# Patient Record
Sex: Male | Born: 1965 | Race: Black or African American | Hispanic: No | Marital: Married | Smoking: Never smoker
Health system: Southern US, Community
[De-identification: ages and names within clinical notes are randomized; demographics above are authoritative.]

## PROBLEM LIST (undated history)

## (undated) DIAGNOSIS — K648 Other hemorrhoids: Secondary | ICD-10-CM

## (undated) DIAGNOSIS — K219 Gastro-esophageal reflux disease without esophagitis: Secondary | ICD-10-CM

## (undated) HISTORY — PX: COLONOSCOPY: SHX174

---

## 2008-09-17 ENCOUNTER — Emergency Department: Payer: Self-pay | Admitting: Unknown Physician Specialty

## 2009-04-03 ENCOUNTER — Other Ambulatory Visit: Payer: Self-pay | Admitting: General Practice

## 2011-06-01 ENCOUNTER — Other Ambulatory Visit: Payer: Self-pay | Admitting: General Practice

## 2011-06-02 LAB — PSA: PSA: 0.7 ng/mL (ref 0.0–4.0)

## 2014-10-28 ENCOUNTER — Encounter: Payer: Self-pay | Admitting: Sports Medicine

## 2014-10-28 ENCOUNTER — Ambulatory Visit: Payer: 59

## 2014-10-28 ENCOUNTER — Ambulatory Visit (INDEPENDENT_AMBULATORY_CARE_PROVIDER_SITE_OTHER): Payer: 59 | Admitting: Sports Medicine

## 2014-10-28 VITALS — BP 155/92 | HR 64 | Resp 17

## 2014-10-28 DIAGNOSIS — M79672 Pain in left foot: Secondary | ICD-10-CM | POA: Diagnosis not present

## 2014-10-28 DIAGNOSIS — M722 Plantar fascial fibromatosis: Secondary | ICD-10-CM

## 2014-10-28 DIAGNOSIS — M216X1 Other acquired deformities of right foot: Secondary | ICD-10-CM

## 2014-10-28 DIAGNOSIS — M216X2 Other acquired deformities of left foot: Secondary | ICD-10-CM

## 2014-10-28 MED ORDER — TRIAMCINOLONE ACETONIDE 10 MG/ML IJ SUSP: 10.0000 mg | Freq: Once | INTRAMUSCULAR | Status: DC

## 2014-10-28 MED ORDER — MELOXICAM 15 MG PO TABS: 15.0000 mg | ORAL_TABLET | Freq: Every day | ORAL | Status: DC

## 2014-10-28 NOTE — Progress Notes (Signed)
Patient ID: Dan Walsh, male   DOB: 04/19/1965, 49 y.o.   MRN: [id number]  Subjective: Dan Walsh is a 49 y.o. male patient presents to office with complaint of heel pain on the left heel. Patient admits to post static dyskinesia for 9 months in duration; sharp pain to heel. Patient has treated this problem with foot massage. Patient reports that this treatment has not worked. 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  No current outpatient prescriptions on file prior to visit.   No current facility-administered medications on file prior to visit.   No Known Allergies  ROS: Negative   Objective: Physical Exam General: The patient is alert and oriented x3 in no acute distress.  Dermatology: Skin is warm, dry and supple bilateral lower extremities. Nails 1-10 are normal. There is no erythema, edema, no eccymosis, no open lesions present. Integument is otherwise unremarkable.  Vascular: Dorsalis Pedis pulse and Posterior Tibial pulse are 2/4 bilateral. Capillary fill time is immediate to all digits.  Neurological: Grossly intact to light touch with an achilles reflex of +2/5 and a  negative Tinel's sign bilateral.  Musculoskeletal: Tenderness to palpation at the medial calcaneal tubercale and through the insertion of the plantar fascia on the left foot. No pain with compression of calcaneus bilateral. No pain with tuning fork to calcaneus bilateral. There is decreased Ankle joint range of motion bilateral. All other joints within normal limits. Strength 5/5 in all groups bilateral. No pain with calf compression bilateral.   Gait: Unassisted, Antalgic avoid weight on left heel   Xray: 3 views, Left foot: No fracture, dislocation,Rectus foot type with mild 1st ray elevatus, Mild  increased density of plantar fascia at insertion at plantar calcaneus.   Assessment and Plan: Problem List Items Addressed This Visit    None    Visit Diagnoses    Left foot pain    -  Primary    Relevant Medications    meloxicam (MOBIC) 15 MG tablet    Other Relevant Orders    DG Foot Complete Left    Plantar fasciitis, left        Relevant Medications    triamcinolone acetonide (KENALOG) 10 MG/ML injection 10 mg    Acquired equinus deformity of both feet          -Complete examination performed. Discussed with patient in detail the condition of plantar fasciitis, how this occurs and general treatment options. Explained both conservative and surgical treatments.  -After oral consent and aseptic prep, injected a mixture containing 1 ml of 1%  plain lidocaine, 1 ml 0.25% plain marcaine, 0.5 ml of kenalog 10 and 0.5 ml of dexamethasone phosphate into left heel. Post-injection care discussed with patient.  -Rx Meloxicam 15mg  PO daily  -Recommended good supportive shoes and advised use of OTC insert. - Explained in detail the use of the fascial brace/ night splint which was dispensed at today's visit. -Explained and dispensed to patient daily stretching exercises. -Recommend patient to ice affected area 1-2x daily. -Patient to return to office in 4 weeks for follow up or sooner if problems or questions  Arise.  Landis Martins, DPM

## 2014-10-28 NOTE — Patient Instructions (Signed)
Plantar Fasciitis Plantar fasciitis is a painful foot condition that affects the heel. It occurs when the band of tissue that connects the toes to the heel bone (plantar fascia) becomes irritated. This can happen after exercising too much or doing other repetitive activities (overuse injury). The pain from plantar fasciitis can range from mild irritation to severe pain that makes it difficult for you to walk or move. The pain is usually worse in the morning or after you have been sitting or lying down for a while. CAUSES This condition may be caused by:  Standing for long periods of time.  Wearing shoes that do not fit.  Doing high-impact activities, including running, aerobics, and ballet.  Being overweight.  Having an abnormal way of walking (gait).  Having tight calf muscles.  Having high arches in your feet.  Starting a new athletic activity. SYMPTOMS The main symptom of this condition is heel pain. Other symptoms include:  Pain that gets worse after activity or exercise.  Pain that is worse in the morning or after resting.  Pain that goes away after you walk for a few minutes. DIAGNOSIS This condition may be diagnosed based on your signs and symptoms. Your health care provider will also do a physical exam to check for:  A tender area on the bottom of your foot.  A high arch in your foot.  Pain when you move your foot.  Difficulty moving your foot. You may also need to have imaging studies to confirm the diagnosis. These can include:  X-rays.  Ultrasound.  MRI. TREATMENT  Treatment for plantar fasciitis depends on the severity of the condition. Your treatment may include:  Rest, ice, and over-the-counter pain medicines to manage your pain.  Exercises to stretch your calves and your plantar fascia.  A splint that holds your foot in a stretched, upward position while you sleep (night splint).  Physical therapy to relieve symptoms and prevent problems in the  future.  Cortisone injections to relieve severe pain.  Extracorporeal shock wave therapy (ESWT) to stimulate damaged plantar fascia with electrical impulses. It is often used as a last resort before surgery.  Surgery, if other treatments have not worked after 12 months. HOME CARE INSTRUCTIONS  Take medicines only as directed by your health care provider.  Avoid activities that cause pain.  Roll the bottom of your foot over a bag of ice or a bottle of cold water. Do this for 20 minutes, 3-4 times a day.  Perform simple stretches as directed by your health care provider.  Try wearing athletic shoes with air-sole or gel-sole cushions or soft shoe inserts.  Wear a night splint while sleeping, if directed by your health care provider.  Keep all follow-up appointments with your health care provider. PREVENTION   Do not perform exercises or activities that cause heel pain.  Consider finding low-impact activities if you continue to have problems.  Lose weight if you need to. The best way to prevent plantar fasciitis is to avoid the activities that aggravate your plantar fascia. SEEK MEDICAL CARE IF:  Your symptoms do not go away after treatment with home care measures.  Your pain gets worse.  Your pain affects your ability to move or do your daily activities.   This information is not intended to replace advice given to you by your health care provider. Make sure you discuss any questions you have with your health care provider.   Document Released: 09/25/2000 Document Revised: 09/21/2014 Document Reviewed: 11/10/2013 Elsevier   Interactive Patient Education 2016 [employer]  

## 2014-11-25 ENCOUNTER — Ambulatory Visit: Payer: 59 | Admitting: Sports Medicine

## 2015-03-03 DIAGNOSIS — J069 Acute upper respiratory infection, unspecified: Secondary | ICD-10-CM | POA: Diagnosis not present

## 2015-04-11 DIAGNOSIS — K625 Hemorrhage of anus and rectum: Secondary | ICD-10-CM | POA: Diagnosis not present

## 2015-04-13 DIAGNOSIS — K625 Hemorrhage of anus and rectum: Secondary | ICD-10-CM | POA: Diagnosis not present

## 2015-05-11 DIAGNOSIS — K621 Rectal polyp: Secondary | ICD-10-CM | POA: Diagnosis not present

## 2015-05-11 DIAGNOSIS — K644 Residual hemorrhoidal skin tags: Secondary | ICD-10-CM | POA: Diagnosis not present

## 2015-05-11 DIAGNOSIS — K635 Polyp of colon: Secondary | ICD-10-CM | POA: Diagnosis not present

## 2015-05-11 DIAGNOSIS — K625 Hemorrhage of anus and rectum: Secondary | ICD-10-CM | POA: Diagnosis not present

## 2015-05-11 DIAGNOSIS — K640 First degree hemorrhoids: Secondary | ICD-10-CM | POA: Diagnosis not present

## 2015-05-11 DIAGNOSIS — D124 Benign neoplasm of descending colon: Secondary | ICD-10-CM | POA: Diagnosis not present

## 2015-05-11 DIAGNOSIS — D129 Benign neoplasm of anus and anal canal: Secondary | ICD-10-CM | POA: Diagnosis not present

## 2015-05-11 DIAGNOSIS — K648 Other hemorrhoids: Secondary | ICD-10-CM | POA: Diagnosis not present

## 2015-05-11 DIAGNOSIS — D127 Benign neoplasm of rectosigmoid junction: Secondary | ICD-10-CM | POA: Diagnosis not present

## 2015-05-18 DIAGNOSIS — K648 Other hemorrhoids: Secondary | ICD-10-CM | POA: Diagnosis not present

## 2015-05-18 DIAGNOSIS — K644 Residual hemorrhoidal skin tags: Secondary | ICD-10-CM | POA: Diagnosis not present

## 2015-08-25 DIAGNOSIS — L237 Allergic contact dermatitis due to plants, except food: Secondary | ICD-10-CM | POA: Diagnosis not present

## 2015-08-29 DIAGNOSIS — L237 Allergic contact dermatitis due to plants, except food: Secondary | ICD-10-CM | POA: Diagnosis not present

## 2015-08-29 DIAGNOSIS — B354 Tinea corporis: Secondary | ICD-10-CM | POA: Diagnosis not present

## 2015-09-01 DIAGNOSIS — Z0000 Encounter for general adult medical examination without abnormal findings: Secondary | ICD-10-CM | POA: Diagnosis not present

## 2016-01-01 DIAGNOSIS — H5213 Myopia, bilateral: Secondary | ICD-10-CM | POA: Diagnosis not present

## 2016-02-06 DIAGNOSIS — M26622 Arthralgia of left temporomandibular joint: Secondary | ICD-10-CM | POA: Diagnosis not present

## 2016-02-29 DIAGNOSIS — K611 Rectal abscess: Secondary | ICD-10-CM | POA: Diagnosis not present

## 2016-04-01 DIAGNOSIS — K611 Rectal abscess: Secondary | ICD-10-CM | POA: Diagnosis not present

## 2016-04-09 ENCOUNTER — Encounter
Admission: RE | Admit: 2016-04-09 | Discharge: 2016-04-09 | Disposition: A | Discharge status: Home / Self Care | Payer: 59 | Source: Ambulatory Visit | Attending: Surgery | Admitting: Surgery

## 2016-04-09 HISTORY — DX: Other hemorrhoids: K64.8

## 2016-04-09 HISTORY — DX: Gastro-esophageal reflux disease without esophagitis: K21.9

## 2016-04-09 NOTE — Patient Instructions (Signed)
  Your procedure is scheduled on: 04-16-16 Report to Same Day Surgery 2nd floor medical mall [facility] Entrance-take elevator on left to 2nd floor.  Check in with surgery information desk.) To find out your arrival time please call [phone] between 1PM - 3PM on 04-15-16  Remember: Instructions that are not followed completely may result in serious medical risk, up to and including death, or upon the discretion of your surgeon and anesthesiologist your surgery may need to be rescheduled.    _x___ 1. Do not eat food or drink liquids after midnight. No gum chewing or hard candies.     __x__ 2. No Alcohol for 24 hours before or after surgery.   __x__3. No Smoking for 24 prior to surgery.   ____  4. Bring all medications with you on the day of surgery if instructed.    __x__ 5. Notify your doctor if there is any change in your medical condition     (cold, fever, infections).     Do not wear jewelry, make-up, hairpins, clips or nail polish.  Do not wear lotions, powders, or perfumes. You may wear deodorant.  Do not shave 48 hours prior to surgery. Men may shave face and neck.  Do not bring valuables to the hospital.    [facility] is not responsible for any belongings or valuables.               Contacts, dentures or bridgework may not be worn into surgery.  Leave your suitcase in the car. After surgery it may be brought to your room.  For patients admitted to the hospital, discharge time is determined by your treatment team.   Patients discharged the day of surgery will not be allowed to drive home.  You will need someone to drive you home and stay with you the night of your procedure.    Please read over the following fact sheets that you were given:     _x___ Take anti-hypertensive (unless it includes a diuretic), cardiac, seizure, asthma,     anti-reflux and psychiatric medicines. These include:  1. OMEPRAZOLE  2. TAKE AN OMEPRAZOLE ON Monday NIGHT BEFORE  BED  3.  4.  5.  6.  ____Fleets enema or Magnesium Citrate as directed.   ____ Use CHG Soap or sage wipes as directed on instruction sheet   ____ Use inhalers on the day of surgery and bring to hospital day of surgery  ____ Stop Metformin and Janumet 2 days prior to surgery.    ____ Take 1/2 of usual insulin dose the night before surgery and none on the morning     surgery.   ____ Follow recommendations from Cardiologist, Pulmonologist or PCP regarding stopping Aspirin, Coumadin, Pllavix ,Eliquis, Effient, or Pradaxa, and Pletal.  X____Stop Anti-inflammatories such as Advil, Aleve, IBUPROFEN, Motrin, Naproxen, Naprosyn, Goodies powders or aspirin products NOW-OK to take Tylenol    ____ Stop supplements until after surgery.     ____ Bring C-Pap to the hospital.

## 2016-04-16 ENCOUNTER — Ambulatory Visit: Payer: 59 | Admitting: Certified Registered Nurse Anesthetist

## 2016-04-16 ENCOUNTER — Ambulatory Visit
Admission: RE | Admit: 2016-04-16 | Discharge: 2016-04-16 | Disposition: A | Discharge status: Home / Self Care | Payer: 59 | Source: Ambulatory Visit | Attending: Surgery | Admitting: Surgery

## 2016-04-16 ENCOUNTER — Encounter: Payer: Self-pay | Admitting: *Deleted

## 2016-04-16 ENCOUNTER — Encounter
Admission: RE | Disposition: A | Discharge status: Home / Self Care | Payer: Self-pay | Source: Ambulatory Visit | Attending: Surgery

## 2016-04-16 DIAGNOSIS — K219 Gastro-esophageal reflux disease without esophagitis: Secondary | ICD-10-CM | POA: Diagnosis not present

## 2016-04-16 DIAGNOSIS — K644 Residual hemorrhoidal skin tags: Secondary | ICD-10-CM | POA: Diagnosis not present

## 2016-04-16 DIAGNOSIS — K648 Other hemorrhoids: Secondary | ICD-10-CM | POA: Insufficient documentation

## 2016-04-16 DIAGNOSIS — K603 Anal fistula: Secondary | ICD-10-CM | POA: Diagnosis not present

## 2016-04-16 DIAGNOSIS — K611 Rectal abscess: Secondary | ICD-10-CM | POA: Diagnosis not present

## 2016-04-16 HISTORY — PX: FISTULOTOMY: SHX6413

## 2016-04-16 HISTORY — PX: RECTAL EXAM UNDER ANESTHESIA: SHX6399

## 2016-04-16 SURGERY — EXAM UNDER ANESTHESIA, RECTUM
Anesthesia: General | Site: Rectum | Wound class: Clean Contaminated

## 2016-04-16 MED ORDER — MIDAZOLAM HCL 2 MG/2ML IJ SOLN
INTRAMUSCULAR | Status: AC
  Filled 2016-04-16: qty 2

## 2016-04-16 MED ORDER — BUPIVACAINE-EPINEPHRINE (PF) 0.5% -1:200000 IJ SOLN
INTRAMUSCULAR | Status: AC
  Filled 2016-04-16: qty 30

## 2016-04-16 MED ORDER — LIDOCAINE HCL (CARDIAC) 20 MG/ML IV SOLN
INTRAVENOUS | Status: DC | PRN
  Administered 2016-04-16: 100 mg via INTRAVENOUS

## 2016-04-16 MED ORDER — ONDANSETRON HCL 4 MG/2ML IJ SOLN
INTRAMUSCULAR | Status: DC | PRN
  Administered 2016-04-16: 4 mg via INTRAVENOUS

## 2016-04-16 MED ORDER — BUPIVACAINE-EPINEPHRINE (PF) 0.5% -1:200000 IJ SOLN
INTRAMUSCULAR | Status: DC | PRN
  Administered 2016-04-16: 3 mL

## 2016-04-16 MED ORDER — LACTATED RINGERS IV SOLN
INTRAVENOUS | Status: DC
  Administered 2016-04-16: 12:00:00 via INTRAVENOUS
  Administered 2016-04-16: 50 mL/h via INTRAVENOUS

## 2016-04-16 MED ORDER — ONDANSETRON HCL 4 MG/2ML IJ SOLN: 4.0000 mg | Freq: Once | INTRAMUSCULAR | Status: DC | PRN

## 2016-04-16 MED ORDER — LIDOCAINE HCL (PF) 2 % IJ SOLN
INTRAMUSCULAR | Status: AC
  Filled 2016-04-16: qty 2

## 2016-04-16 MED ORDER — HYDROCODONE-ACETAMINOPHEN 5-325 MG PO TABS: 1.0000 | ORAL_TABLET | ORAL | Status: DC | PRN

## 2016-04-16 MED ORDER — HYDROCODONE-ACETAMINOPHEN 5-325 MG PO TABS: 1.0000 | ORAL_TABLET | ORAL | 0 refills | Status: AC | PRN

## 2016-04-16 MED ORDER — MIDAZOLAM HCL 2 MG/2ML IJ SOLN
INTRAMUSCULAR | Status: DC | PRN
  Administered 2016-04-16: 2 mg via INTRAVENOUS

## 2016-04-16 MED ORDER — PROPOFOL 10 MG/ML IV BOLUS
INTRAVENOUS | Status: DC | PRN
  Administered 2016-04-16: 200 mg via INTRAVENOUS

## 2016-04-16 MED ORDER — FENTANYL CITRATE (PF) 100 MCG/2ML IJ SOLN
INTRAMUSCULAR | Status: DC | PRN
  Administered 2016-04-16 (×3): 25 ug via INTRAVENOUS

## 2016-04-16 MED ORDER — ONDANSETRON HCL 4 MG/2ML IJ SOLN
INTRAMUSCULAR | Status: AC
  Filled 2016-04-16: qty 2

## 2016-04-16 MED ORDER — FENTANYL CITRATE (PF) 100 MCG/2ML IJ SOLN
INTRAMUSCULAR | Status: AC
Start: 2016-04-16 — End: ?
  Filled 2016-04-16: qty 2

## 2016-04-16 MED ORDER — PROPOFOL 10 MG/ML IV BOLUS
INTRAVENOUS | Status: AC
  Filled 2016-04-16: qty 20

## 2016-04-16 MED ORDER — FENTANYL CITRATE (PF) 100 MCG/2ML IJ SOLN: 25.0000 ug | INTRAMUSCULAR | Status: DC | PRN

## 2016-04-16 SURGICAL SUPPLY — 27 items
BLADE SURG 15 STRL LF DISP TIS (BLADE) ×1 IMPLANT
BLADE SURG 15 STRL SS (BLADE) ×2
CANISTER SUCT 1200ML W/VALVE (MISCELLANEOUS) ×3 IMPLANT
DRAPE LAPAROTOMY 100X77 ABD (DRAPES) ×3 IMPLANT
DRAPE LEGGINS SURG 28X43 STRL (DRAPES) ×3 IMPLANT
ELECT REM PT RETURN 9FT ADLT (ELECTROSURGICAL) ×3
ELECTRODE REM PT RTRN 9FT ADLT (ELECTROSURGICAL) ×1 IMPLANT
GAUZE SPONGE 4X4 12PLY STRL (GAUZE/BANDAGES/DRESSINGS) ×3 IMPLANT
GLOVE BIO SURGEON STRL SZ7.5 (GLOVE) ×3 IMPLANT
GOWN STRL REUS W/ TWL LRG LVL3 (GOWN DISPOSABLE) ×5 IMPLANT
GOWN STRL REUS W/TWL LRG LVL3 (GOWN DISPOSABLE) ×10
KIT RM TURNOVER CYSTO AR (KITS) ×3 IMPLANT
KIT RM TURNOVER STRD PROC AR (KITS) ×3 IMPLANT
LABEL OR SOLS (LABEL) ×3 IMPLANT
NEEDLE HYPO 25X1 1.5 SAFETY (NEEDLE) ×3 IMPLANT
NS IRRIG 500ML POUR BTL (IV SOLUTION) ×3 IMPLANT
PACK BASIN MINOR ARMC (MISCELLANEOUS) ×3 IMPLANT
PAD PREP 24X41 OB/GYN DISP (PERSONAL CARE ITEMS) ×3 IMPLANT
SOL PREP PVP 2OZ (MISCELLANEOUS) ×3
SOLUTION PREP PVP 2OZ (MISCELLANEOUS) ×1 IMPLANT
SUCT SIGMOIDOSCOPE TIP 18 W/TU (SUCTIONS) IMPLANT
SURGILUBE 2OZ TUBE FLIPTOP (MISCELLANEOUS) ×3 IMPLANT
SUT CHROMIC 3 0 SH 27 (SUTURE) IMPLANT
SUT CHROMIC 5 0 RB 1 27 (SUTURE) IMPLANT
SUT VIC AB 3-0 SH 27 (SUTURE)
SUT VIC AB 3-0 SH 27X BRD (SUTURE) IMPLANT
SYRINGE 10CC LL (SYRINGE) ×3 IMPLANT

## 2016-04-16 NOTE — Op Note (Signed)
OPERATIVE REPORT  PREOPERATIVE  DIAGNOSIS: . Anal fistula  POSTOPERATIVE DIAGNOSIS: . Anal fistula, internal and external hemorrhoids  PROCEDURE: . Rectal exam under anesthesia, anal fistulotomy  ANESTHESIA:  General  SURGEON: Rochel Brome  MD   INDICATIONS: . She has a history of perianal abscess and intermittent drainage. Physical exam was suspicious of anal fistula. Exam under anesthesia and possible surgery was recommended.  With the patient on the operating table in the supine position he was placed under general anesthesia. The legs for elevated into the lithotomy position using ankle straps. The perineum was prepared with Betadine solution and draped with sterile towels and sheets. Initial inspection revealed a small opening in the skin which was on the patient's left side and slightly anterior. There was some evidence of external hemorrhoids. A probe was inserted into the opening and was manipulated and went into the distal aspect of the anal canal at approximately 1:00 position. The anoderm was infiltrated with half percent Sensorcaine with epinephrine. The anal canal was dilated large enough to admit 3 fingers. The bivalve anal retractor was introduced. There was moderate enlargement of internal hemorrhoids. No polyp or tumor was seen.  With the probe in place I fistulotomy was carried out beginning with the scalpel and continued with electrocautery which cut across a portion of the internal anal sphincter. A curet was used to debride the fistulous tract. Several small bleeding points are cauterized. Hemostasis was subsequent intact. Socially dressings were applied with 2 inch paper tape. The patient tolerated surgery satisfactorily and was prepared for transfer to recovery room . Rochel Brome M.D.

## 2016-04-16 NOTE — Transfer of Care (Signed)
Immediate Anesthesia Transfer of Care Note  Patient: Dan Walsh  Procedure(s) Performed: Procedure(s): RECTAL EXAM UNDER ANESTHESIA (N/A) FISTULOTOMY (N/A)  Patient Location: PACU  Anesthesia Type:General  Level of Consciousness: sedated  Airway & Oxygen Therapy: Patient Spontanous Breathing and Patient connected to face mask oxygen  Post-op Assessment: Report given to RN and Post -op Vital signs reviewed and stable  Post vital signs: Reviewed and stable  Last Vitals:  Vitals:   04/16/16 0836 04/16/16 1227  BP: (!) 148/94 128/72  Pulse: 69 71  Resp: 16 11  Temp: 36.9 C     Last Pain:  Vitals:   04/16/16 0836  TempSrc: Oral  PainSc: 6       Patients Stated Pain Goal: 0 (35/52/17 4715)  Complications: No apparent anesthesia complications

## 2016-04-16 NOTE — H&P (Signed)
  He comes in today for rectal exam under anesthesia. There is a suspicion of an anal fistula  He reports no change in overall condition since the office exam.  Discussed the plan for rectal exam under anesthesia and possible drainage of abscess, possible fistulotomy, possible insertion of seton.

## 2016-04-16 NOTE — Anesthesia Procedure Notes (Signed)
Procedure Name: LMA Insertion Performed by: Greenly Rarick Pre-anesthesia Checklist: Patient identified, Patient being monitored, Timeout performed, Emergency Drugs available and Suction available Patient Re-evaluated:Patient Re-evaluated prior to inductionOxygen Delivery Method: Circle system utilized Preoxygenation: Pre-oxygenation with 100% oxygen Intubation Type: IV induction Ventilation: Mask ventilation without difficulty LMA: LMA inserted LMA Size: 5.0 Tube type: Oral Number of attempts: 1 Placement Confirmation: positive ETCO2 and breath sounds checked- equal and bilateral Tube secured with: Tape Dental Injury: Teeth and Oropharynx as per pre-operative assessment      

## 2016-04-16 NOTE — Discharge Instructions (Signed)
Take Tylenol or Norco if needed for pain.  Should not drive or do anything dangerous when taking Norco.  Remove dressing later today or tomorrow. Made then shower and or sit in warm water.  Tuck gauze or pad in underwear and change as needed for drainage.

## 2016-04-16 NOTE — Anesthesia Preprocedure Evaluation (Signed)
Anesthesia Evaluation  Patient identified by MRN, date of birth, ID band Patient awake    Reviewed: Allergy & Precautions, H&P , NPO status , Patient's Chart, lab work & pertinent test results, reviewed documented beta blocker date and time   Airway Mallampati: II  TM Distance: >3 FB Neck ROM: full    Dental  (+) Teeth Intact   Pulmonary neg pulmonary ROS,    Pulmonary exam normal        Cardiovascular Exercise Tolerance: Good negative cardio ROS Normal cardiovascular exam Rate:Normal     Neuro/Psych negative neurological ROS  negative psych ROS   GI/Hepatic negative GI ROS, Neg liver ROS, GERD  Medicated,  Endo/Other  negative endocrine ROS  Renal/GU negative Renal ROS  negative genitourinary   Musculoskeletal   Abdominal   Peds  Hematology negative hematology ROS (+)   Anesthesia Other Findings   Reproductive/Obstetrics negative OB ROS                             Anesthesia Physical Anesthesia Plan  ASA: II  Anesthesia Plan: General LMA   Post-op Pain Management:    Induction:   Airway Management Planned:   Additional Equipment:   Intra-op Plan:   Post-operative Plan:   Informed Consent: I have reviewed the patients History and Physical, chart, labs and discussed the procedure including the risks, benefits and alternatives for the proposed anesthesia with the patient or authorized representative who has indicated his/her understanding and acceptance.     Plan Discussed with: CRNA  Anesthesia Plan Comments:         Anesthesia Quick Evaluation

## 2016-04-16 NOTE — Anesthesia Post-op Follow-up Note (Cosign Needed)
Anesthesia QCDR form completed.        

## 2016-04-17 ENCOUNTER — Encounter: Payer: Self-pay | Admitting: Surgery

## 2016-04-17 MED ORDER — PHENYLEPHRINE HCL 10 MG/ML IJ SOLN
INTRAMUSCULAR | Status: AC
  Filled 2016-04-17: qty 1

## 2016-04-18 NOTE — Anesthesia Postprocedure Evaluation (Signed)
Anesthesia Post Note  Patient: Dan Walsh  Procedure(s) Performed: Procedure(s) (LRB): RECTAL EXAM UNDER ANESTHESIA (N/A) FISTULOTOMY (N/A)  Patient location during evaluation: PACU Anesthesia Type: General Level of consciousness: awake and alert Pain management: pain level controlled Vital Signs Assessment: post-procedure vital signs reviewed and stable Respiratory status: spontaneous breathing, nonlabored ventilation, respiratory function stable and patient connected to nasal cannula oxygen Cardiovascular status: blood pressure returned to baseline and stable Postop Assessment: no signs of nausea or vomiting Anesthetic complications: no     Last Vitals:  Vitals:   04/16/16 1315 04/16/16 1327  BP: 133/84 131/80  Pulse: 66 64  Resp: 11 16  Temp: 36.2 C 36.6 C    Last Pain:  Vitals:   04/17/16 0817  TempSrc:   PainSc: 0-No pain                 Molli Barrows

## 2016-08-15 DIAGNOSIS — R300 Dysuria: Secondary | ICD-10-CM | POA: Diagnosis not present

## 2016-09-02 DIAGNOSIS — Z125 Encounter for screening for malignant neoplasm of prostate: Secondary | ICD-10-CM | POA: Diagnosis not present

## 2016-09-02 DIAGNOSIS — Z13220 Encounter for screening for lipoid disorders: Secondary | ICD-10-CM | POA: Diagnosis not present

## 2016-09-02 DIAGNOSIS — Z0000 Encounter for general adult medical examination without abnormal findings: Secondary | ICD-10-CM | POA: Diagnosis not present

## 2016-09-23 DIAGNOSIS — Z13220 Encounter for screening for lipoid disorders: Secondary | ICD-10-CM | POA: Diagnosis not present

## 2016-09-23 DIAGNOSIS — R972 Elevated prostate specific antigen [PSA]: Secondary | ICD-10-CM | POA: Diagnosis not present

## 2016-10-08 DIAGNOSIS — M4802 Spinal stenosis, cervical region: Secondary | ICD-10-CM | POA: Diagnosis not present

## 2016-10-08 DIAGNOSIS — M5412 Radiculopathy, cervical region: Secondary | ICD-10-CM | POA: Diagnosis not present

## 2016-10-10 ENCOUNTER — Other Ambulatory Visit: Payer: Self-pay | Admitting: Family Medicine

## 2016-10-10 DIAGNOSIS — M5412 Radiculopathy, cervical region: Secondary | ICD-10-CM

## 2016-10-15 ENCOUNTER — Ambulatory Visit
Admission: RE | Admit: 2016-10-15 | Discharge: 2016-10-15 | Disposition: A | Discharge status: Home / Self Care | Payer: 59 | Source: Ambulatory Visit | Attending: Family Medicine | Admitting: Family Medicine

## 2016-10-15 DIAGNOSIS — M4802 Spinal stenosis, cervical region: Secondary | ICD-10-CM | POA: Insufficient documentation

## 2016-10-15 DIAGNOSIS — M5412 Radiculopathy, cervical region: Secondary | ICD-10-CM | POA: Insufficient documentation

## 2016-10-16 DIAGNOSIS — M5020 Other cervical disc displacement, unspecified cervical region: Secondary | ICD-10-CM | POA: Diagnosis not present

## 2016-10-16 DIAGNOSIS — M5412 Radiculopathy, cervical region: Secondary | ICD-10-CM | POA: Diagnosis not present

## 2016-10-16 DIAGNOSIS — M62838 Other muscle spasm: Secondary | ICD-10-CM | POA: Diagnosis not present

## 2017-09-29 DIAGNOSIS — Z0000 Encounter for general adult medical examination without abnormal findings: Secondary | ICD-10-CM | POA: Diagnosis not present

## 2017-09-29 DIAGNOSIS — Z8709 Personal history of other diseases of the respiratory system: Secondary | ICD-10-CM | POA: Diagnosis not present

## 2018-03-03 DIAGNOSIS — H524 Presbyopia: Secondary | ICD-10-CM | POA: Diagnosis not present

## 2018-06-13 IMAGING — MR MR CERVICAL SPINE W/O CM
5 series · 33 of 48 positions shown · non-contrast
Comparison: None.

CLINICAL DATA: Neck and LEFT arm pain, hand pain with tingling for
1 month. No injury.

EXAM:
MRI CERVICAL SPINE WITHOUT CONTRAST
TECHNIQUE: Multiplanar, multisequence MR imaging of the cervical spine was
performed. No intravenous contrast was administered.

[Series 3: T2 · sagittal · 3.0mm · 0.70mm/px · 6 of 13 slices shown (1 of 2)]
[im 1/13]
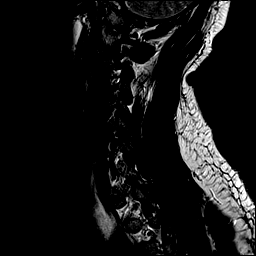
[im 3/13]
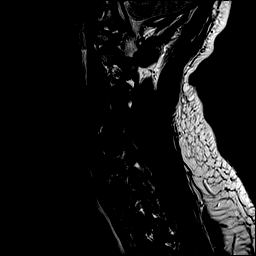
[im 5/13]
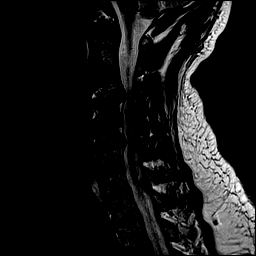
[im 8/13]
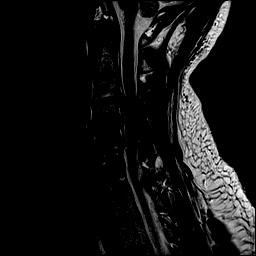
[im 10/13]
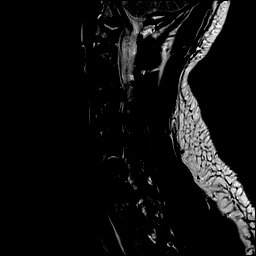
[im 13/13]
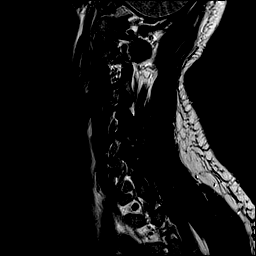

[Series 4: T1 · sagittal · 3.0mm · 0.70mm/px · 7 of 13 slices shown]
[im 1/13]
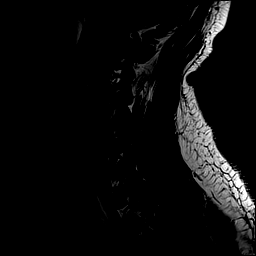
[im 3/13]
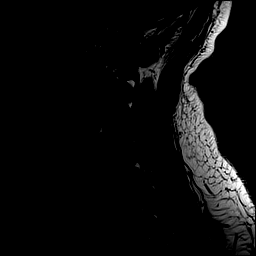
[im 5/13]
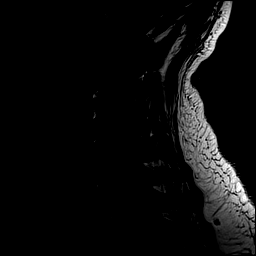
[im 7/13]
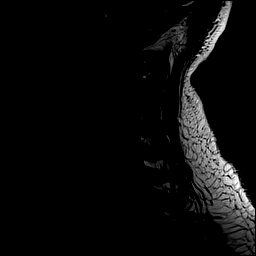
[im 9/13]
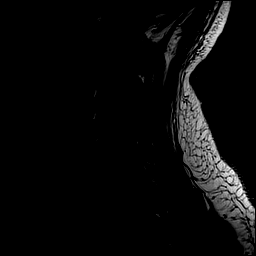
[im 11/13]
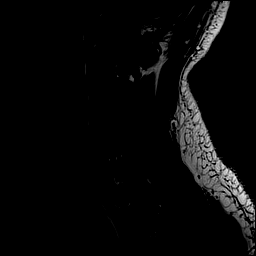
[im 13/13]
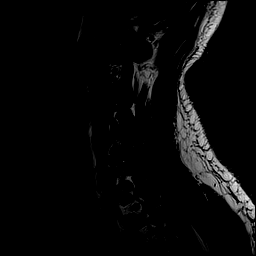

[Series 5: STIR · sagittal · 3.0mm · 0.35mm/px · 7 of 13 slices shown]
[im 1/13]
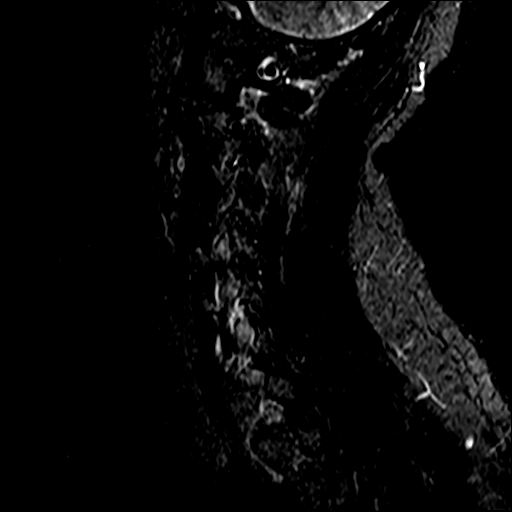
[im 3/13]
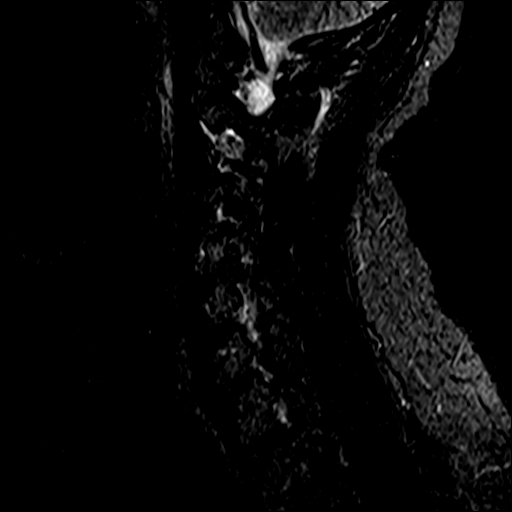
[im 5/13]
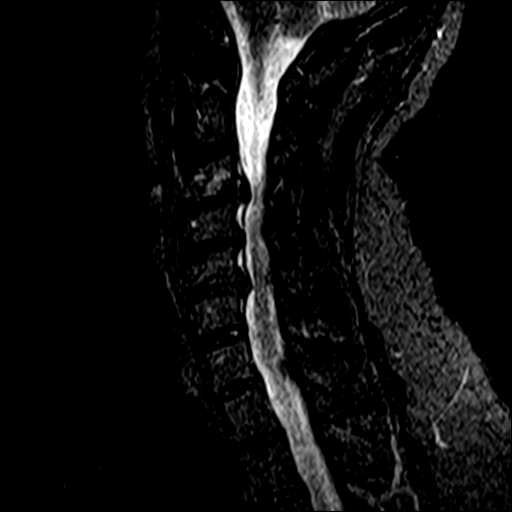
[im 7/13]
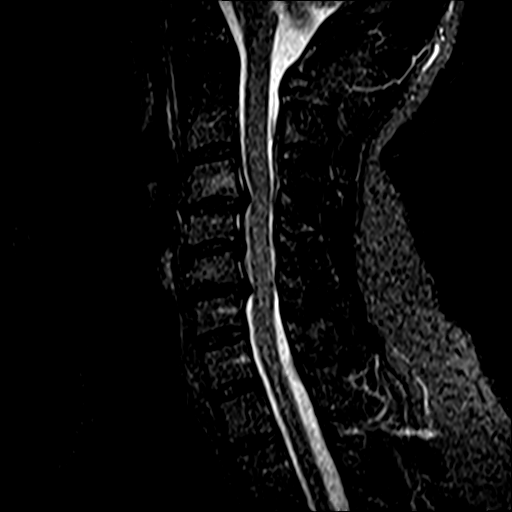
[im 9/13]
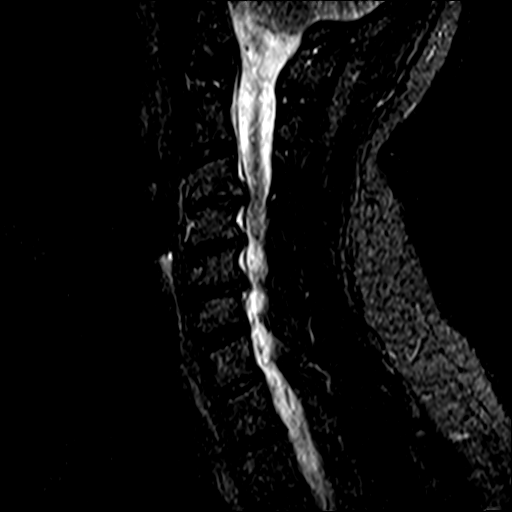
[im 11/13]
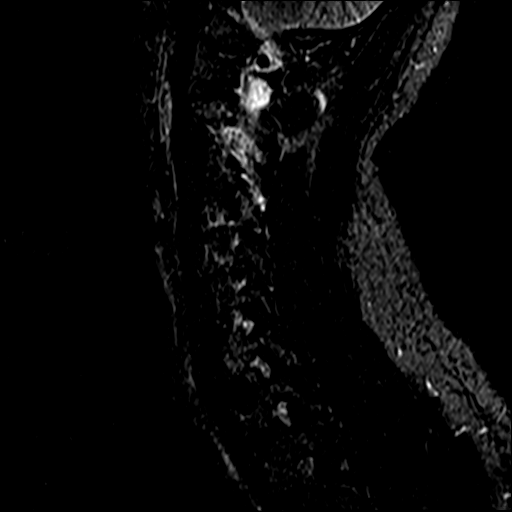
[im 13/13]
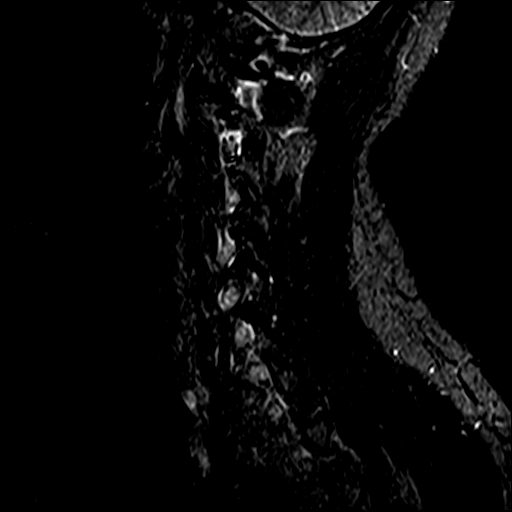

[Series 6: T2 · axial · 3.0mm · 0.70mm/px · z∈[-67,+36]mm · 8 of 28 slices shown (2 of 2)]
[im 1/28]
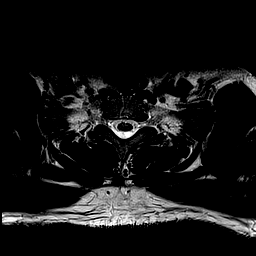
[im 5/28]
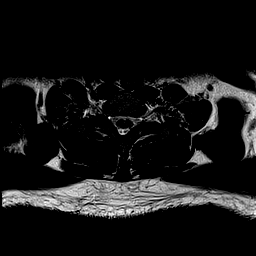
[im 9/28]
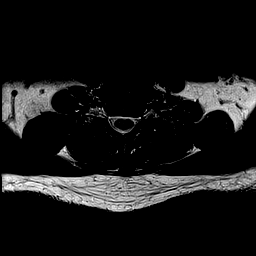
[im 13/28]
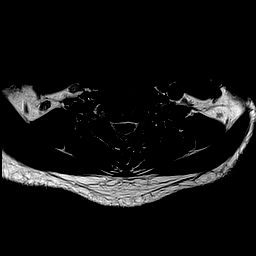
[im 15/28]
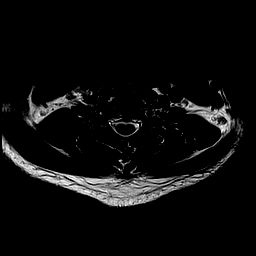
[im 19/28]
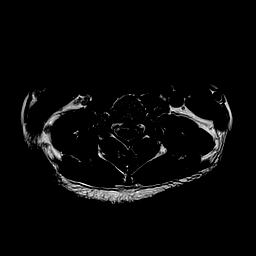
[im 23/28]
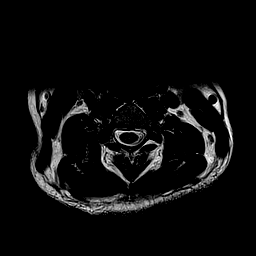
[im 28/28]
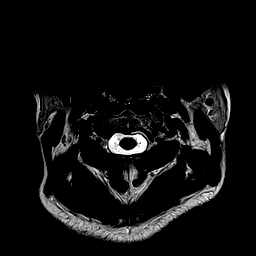

[Series 7: mpgr ax · axial · 3.0mm · 0.35mm/px · z∈[-67,-14]mm · 5 of 28 slices shown]
[im 1/28]
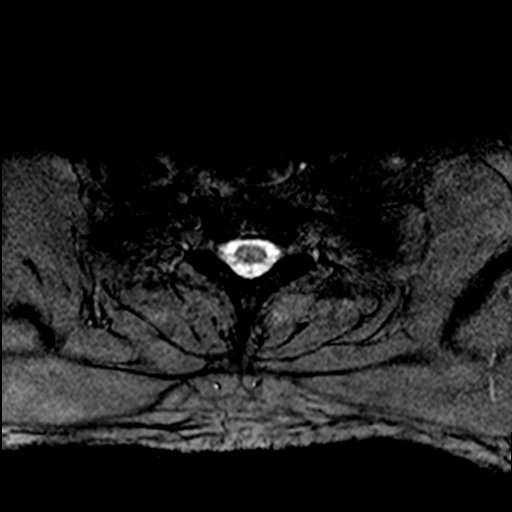
[im 5/28]
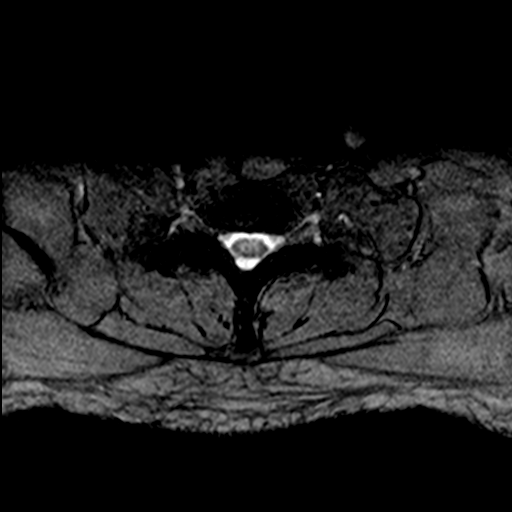
[im 9/28]
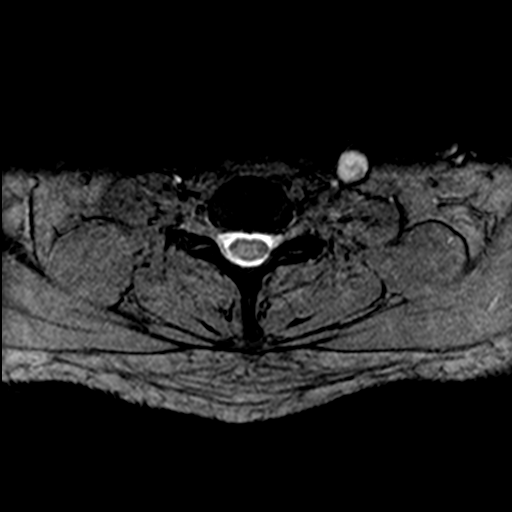
[im 13/28]
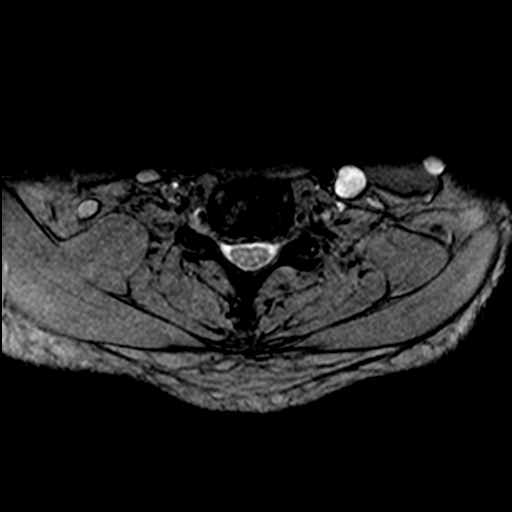
[im 15/28]
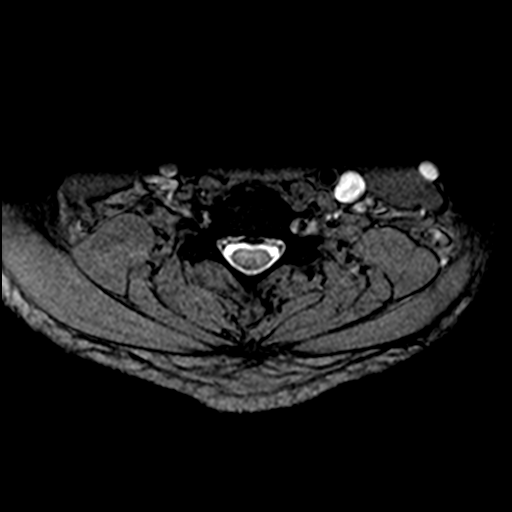

[33 of 48 positions shown; findings below may reference images not displayed]

FINDINGS: ALIGNMENT: Straightened cervical lordosis.  No malalignment.

VERTEBRAE/DISCS: Vertebral bodies are intact. Moderate to severe
C3-4, moderate C4-5 and C5-6 disc height loss with moderate chronic
discogenic endplate changes, mild acute component C3-4. Low T2
signal disc compatible with desiccation. No suspicious bone marrow
signal.

CORD:Cervical spinal cord is normal morphology and signal
characteristics from the cervicomedullary junction to level of T1-2,
the most caudal well visualized level.

POSTERIOR FOSSA, VERTEBRAL ARTERIES, PARASPINAL TISSUES: No MR
findings of ligamentous injury. Vertebral artery flow voids present.
Included posterior fossa and paraspinal soft tissues are normal.

DISC LEVELS:

C2-3: No disc bulge, canal stenosis nor neural foraminal narrowing.

C3-4: 3 mm broad-based disc bulge, uncovertebral hypertrophy. Mild
to moderate canal stenosis. Severe RIGHT greater than LEFT neural
foraminal narrowing.

C4-5: 3 mm broad-based disc bulge, uncovertebral hypertrophy. Mild
canal stenosis. Severe RIGHT, moderate to severe LEFT neural
foraminal narrowing.

C5-6: 3 mm broad-based disc bulge, uncovertebral hypertrophy. Mild
to moderate canal stenosis. Severe bilateral neural foraminal
narrowing.

C6-7: No disc bulge, canal stenosis nor neural foraminal narrowing.
Minimal LEFT facet arthropathy.

C7-T1: No disc bulge or canal stenosis. Moderate facet arthropathy.
Moderate RIGHT neural foraminal narrowing.
IMPRESSION: 1. Degenerative change of the cervical spine resulting in mild to
moderate canal stenosis C3-4 and C5-6, mild at C4-5.
2. Neural foraminal narrowing C3-4 thru C5-6 and, C7-T1: Severe from
C3-4 through C5-6.

## 2018-10-05 DIAGNOSIS — Z131 Encounter for screening for diabetes mellitus: Secondary | ICD-10-CM | POA: Diagnosis not present

## 2018-10-05 DIAGNOSIS — Z0000 Encounter for general adult medical examination without abnormal findings: Secondary | ICD-10-CM | POA: Diagnosis not present

## 2018-10-05 DIAGNOSIS — Z125 Encounter for screening for malignant neoplasm of prostate: Secondary | ICD-10-CM | POA: Diagnosis not present

## 2018-10-05 DIAGNOSIS — Z8709 Personal history of other diseases of the respiratory system: Secondary | ICD-10-CM | POA: Diagnosis not present

## 2018-10-05 DIAGNOSIS — Z13220 Encounter for screening for lipoid disorders: Secondary | ICD-10-CM | POA: Diagnosis not present

## 2019-03-27 ENCOUNTER — Other Ambulatory Visit: Payer: Self-pay

## 2019-03-27 ENCOUNTER — Ambulatory Visit: Payer: 59 | Attending: Internal Medicine

## 2019-03-27 DIAGNOSIS — Z23 Encounter for immunization: Secondary | ICD-10-CM

## 2019-03-27 NOTE — Progress Notes (Signed)
   Covid-19 Vaccination Clinic  Name:  Toua Droge    MRN: [id number] DOB: 21-Feb-1965  03/27/2019  Mr. Cicalese was observed post Covid-19 immunization for 15 minutes without incident. He was provided with Vaccine Information Sheet and instruction to access the V-Safe system.   Mr. Drotar was instructed to call 911 with any severe reactions post vaccine: Marland Kitchen Difficulty breathing  . Swelling of face and throat  . A fast heartbeat  . A bad rash all over body  . Dizziness and weakness   Immunizations Administered    Name Date Dose VIS Date Route   Pfizer COVID-19 Vaccine 03/27/2019  8:22 AM 0.3 mL 12/25/2018 Intramuscular   Manufacturer: [place]   Lot: [id number]   [place]: [id number]

## 2019-04-20 ENCOUNTER — Ambulatory Visit: Payer: 59 | Attending: Internal Medicine

## 2019-04-20 DIAGNOSIS — Z23 Encounter for immunization: Secondary | ICD-10-CM

## 2019-04-20 NOTE — Progress Notes (Signed)
   Covid-19 Vaccination Clinic  Name:  Dan Walsh    MRN: [id number] DOB: 29-Dec-1965  04/20/2019  [name] was observed post Covid-19 immunization for 15 minutes without incident. He was provided with Vaccine Information Sheet and instruction to access the V-Safe system.   [name] was instructed to call 911 with any severe reactions post vaccine: Marland Kitchen Difficulty breathing  . Swelling of face and throat  . A fast heartbeat  . A bad rash all over body  . Dizziness and weakness   Immunizations Administered    Name Date Dose VIS Date Route   Pfizer COVID-19 Vaccine 04/20/2019  4:30 PM 0.3 mL 12/25/2018 Intramuscular   Manufacturer: [place]   Lot: [id number]   [place]: [id number]

## 2019-05-31 DIAGNOSIS — M79602 Pain in left arm: Secondary | ICD-10-CM | POA: Diagnosis not present

## 2019-05-31 DIAGNOSIS — M47812 Spondylosis without myelopathy or radiculopathy, cervical region: Secondary | ICD-10-CM | POA: Diagnosis not present

## 2019-10-11 DIAGNOSIS — Z0000 Encounter for general adult medical examination without abnormal findings: Secondary | ICD-10-CM | POA: Diagnosis not present

## 2019-12-18 DIAGNOSIS — H524 Presbyopia: Secondary | ICD-10-CM | POA: Diagnosis not present

## 2020-05-01 ENCOUNTER — Other Ambulatory Visit: Payer: Self-pay

## 2020-05-01 MED ORDER — CHLORHEXIDINE GLUCONATE 0.12 % MT SOLN
OROMUCOSAL | 0 refills | Status: AC
  Filled 2020-05-01: qty 473, 16d supply, fill #0

## 2020-05-01 MED ORDER — OXYCODONE-ACETAMINOPHEN 5-325 MG PO TABS
ORAL_TABLET | ORAL | 0 refills | Status: AC
  Filled 2020-05-01: qty 12, 2d supply, fill #0

## 2020-05-02 ENCOUNTER — Other Ambulatory Visit: Payer: Self-pay

## 2020-05-02 MED ORDER — OMEPRAZOLE 20 MG PO CPDR
DELAYED_RELEASE_CAPSULE | ORAL | 2 refills | Status: AC
  Filled 2020-05-02: qty 30, 30d supply, fill #0

## 2020-05-16 ENCOUNTER — Other Ambulatory Visit: Payer: Self-pay

## 2020-08-14 ENCOUNTER — Other Ambulatory Visit: Payer: Self-pay

## 2020-08-14 MED ORDER — IBUPROFEN 600 MG PO TABS
600.0000 mg | ORAL_TABLET | Freq: Four times a day (QID) | ORAL | 0 refills | Status: AC
  Filled 2020-08-14: qty 16, 4d supply, fill #0

## 2020-08-14 MED ORDER — AMOXICILLIN 500 MG PO CAPS
ORAL_CAPSULE | ORAL | 2 refills | Status: AC
  Filled 2020-08-14: qty 21, 7d supply, fill #0

## 2020-10-16 ENCOUNTER — Other Ambulatory Visit: Payer: Self-pay

## 2020-10-16 DIAGNOSIS — Z1211 Encounter for screening for malignant neoplasm of colon: Secondary | ICD-10-CM | POA: Diagnosis not present

## 2020-10-16 DIAGNOSIS — Z131 Encounter for screening for diabetes mellitus: Secondary | ICD-10-CM | POA: Diagnosis not present

## 2020-10-16 DIAGNOSIS — Z0000 Encounter for general adult medical examination without abnormal findings: Secondary | ICD-10-CM | POA: Diagnosis not present

## 2020-10-16 DIAGNOSIS — Z125 Encounter for screening for malignant neoplasm of prostate: Secondary | ICD-10-CM | POA: Diagnosis not present

## 2020-10-16 DIAGNOSIS — Z13220 Encounter for screening for lipoid disorders: Secondary | ICD-10-CM | POA: Diagnosis not present

## 2020-10-16 MED ORDER — ALBUTEROL SULFATE HFA 108 (90 BASE) MCG/ACT IN AERS
INHALATION_SPRAY | RESPIRATORY_TRACT | 0 refills | Status: AC
  Filled 2020-10-16: qty 18, 30d supply, fill #0

## 2020-10-25 ENCOUNTER — Other Ambulatory Visit: Payer: Self-pay

## 2020-10-25 MED ORDER — OMEPRAZOLE 20 MG PO CPDR
DELAYED_RELEASE_CAPSULE | ORAL | 2 refills | Status: AC
  Filled 2020-10-25: qty 30, 30d supply, fill #0
  Filled 2021-10-02: qty 30, 30d supply, fill #1

## 2021-01-19 ENCOUNTER — Other Ambulatory Visit: Payer: Self-pay

## 2021-01-19 DIAGNOSIS — Z86010 Personal history of colonic polyps: Secondary | ICD-10-CM | POA: Diagnosis not present

## 2021-01-19 MED ORDER — NA SULFATE-K SULFATE-MG SULF 17.5-3.13-1.6 GM/177ML PO SOLN
ORAL | 0 refills | Status: AC
  Filled 2021-01-19: qty 354, 1d supply, fill #0

## 2021-02-22 ENCOUNTER — Other Ambulatory Visit: Payer: Self-pay

## 2021-02-22 MED ORDER — CHLORHEXIDINE GLUCONATE 0.12 % MT SOLN
OROMUCOSAL | 12 refills | Status: AC
  Filled 2021-02-22: qty 473, 15d supply, fill #0

## 2021-02-22 MED ORDER — NAPROXEN 250 MG PO TABS: ORAL_TABLET | ORAL | 0 refills | Status: AC

## 2021-02-22 MED ORDER — AMOXICILLIN 500 MG PO CAPS
ORAL_CAPSULE | ORAL | 0 refills | Status: AC
  Filled 2021-02-22: qty 40, 10d supply, fill #0

## 2021-03-30 ENCOUNTER — Encounter: Payer: Self-pay | Admitting: *Deleted

## 2021-04-02 ENCOUNTER — Encounter: Payer: Self-pay | Admitting: *Deleted

## 2021-04-02 ENCOUNTER — Ambulatory Visit
Admission: RE | Admit: 2021-04-02 | Discharge: 2021-04-02 | Disposition: A | Discharge status: Home / Self Care | Payer: 59 | Attending: Gastroenterology | Admitting: Gastroenterology

## 2021-04-02 ENCOUNTER — Ambulatory Visit: Payer: 59 | Admitting: Certified Registered Nurse Anesthetist

## 2021-04-02 ENCOUNTER — Encounter
Admission: RE | Disposition: A | Discharge status: Home / Self Care | Payer: Self-pay | Source: Home / Self Care | Attending: Gastroenterology

## 2021-04-02 DIAGNOSIS — K640 First degree hemorrhoids: Secondary | ICD-10-CM | POA: Diagnosis not present

## 2021-04-02 DIAGNOSIS — Z1211 Encounter for screening for malignant neoplasm of colon: Secondary | ICD-10-CM | POA: Diagnosis not present

## 2021-04-02 DIAGNOSIS — Z79899 Other long term (current) drug therapy: Secondary | ICD-10-CM | POA: Insufficient documentation

## 2021-04-02 DIAGNOSIS — Z791 Long term (current) use of non-steroidal anti-inflammatories (NSAID): Secondary | ICD-10-CM | POA: Insufficient documentation

## 2021-04-02 DIAGNOSIS — Z86010 Personal history of colonic polyps: Secondary | ICD-10-CM | POA: Diagnosis not present

## 2021-04-02 DIAGNOSIS — K649 Unspecified hemorrhoids: Secondary | ICD-10-CM | POA: Diagnosis not present

## 2021-04-02 DIAGNOSIS — K635 Polyp of colon: Secondary | ICD-10-CM | POA: Diagnosis not present

## 2021-04-02 DIAGNOSIS — K219 Gastro-esophageal reflux disease without esophagitis: Secondary | ICD-10-CM | POA: Diagnosis not present

## 2021-04-02 HISTORY — PX: COLONOSCOPY WITH PROPOFOL: SHX5780

## 2021-04-02 SURGERY — COLONOSCOPY WITH PROPOFOL
Anesthesia: General

## 2021-04-02 MED ORDER — PROPOFOL 500 MG/50ML IV EMUL
INTRAVENOUS | Status: DC | PRN
  Administered 2021-04-02: 160 ug/kg/min via INTRAVENOUS

## 2021-04-02 MED ORDER — PROPOFOL 10 MG/ML IV BOLUS
INTRAVENOUS | Status: DC | PRN
  Administered 2021-04-02: 80 mg via INTRAVENOUS

## 2021-04-02 MED ORDER — LIDOCAINE HCL (CARDIAC) PF 100 MG/5ML IV SOSY
PREFILLED_SYRINGE | INTRAVENOUS | Status: DC | PRN
  Administered 2021-04-02: 100 mg via INTRAVENOUS

## 2021-04-02 MED ORDER — SODIUM CHLORIDE 0.9 % IV SOLN: INTRAVENOUS | Status: DC

## 2021-04-02 NOTE — Transfer of Care (Signed)
Immediate Anesthesia Transfer of Care Note ? ?Patient: Dan Walsh ? ?Procedure(s) Performed: COLONOSCOPY WITH PROPOFOL ? ?Patient Location: PACU ? ?Anesthesia Type:General ? ?Level of Consciousness: awake ? ?Airway & Oxygen Therapy: Patient Spontanous Breathing ? ?Post-op Assessment: Report given to RN and Post -op Vital signs reviewed and stable ? ?Post vital signs: Reviewed and stable ? ?Last Vitals:  ?Vitals Value Taken Time  ?BP    ?Temp    ?Pulse    ?Resp    ?SpO2    ? ? ?Last Pain: There were no vitals filed for this visit.   ? ?  ? ?Complications: No notable events documented. ?

## 2021-04-02 NOTE — Anesthesia Procedure Notes (Signed)
Date/Time: 04/02/2021 2:04 PM ?Performed by: Demetrius Charity, CRNA ?Pre-anesthesia Checklist: Patient identified, Emergency Drugs available, Suction available, Patient being monitored and Timeout performed ?Patient Re-evaluated:Patient Re-evaluated prior to induction ?Oxygen Delivery Method: Nasal cannula ?Induction Type: IV induction ?Placement Confirmation: CO2 detector and positive ETCO2 ? ? ? ? ?

## 2021-04-02 NOTE — Anesthesia Preprocedure Evaluation (Signed)
Anesthesia Evaluation  ?Patient identified by MRN, date of birth, ID band ?Patient awake ? ? ? ?Reviewed: ?Allergy & Precautions, NPO status , Patient's Chart, lab work & pertinent test results ? ?History of Anesthesia Complications ?Negative for: history of anesthetic complications ? ?Airway ?Mallampati: III ? ?TM Distance: >3 FB ?Neck ROM: full ? ? ? Dental ? ?(+) Chipped ?  ?Pulmonary ?neg pulmonary ROS, neg shortness of breath,  ?  ?Pulmonary exam normal ? ? ? ? ? ? ? Cardiovascular ?Exercise Tolerance: Good ?(-) anginanegative cardio ROS ?Normal cardiovascular exam ? ? ?  ?Neuro/Psych ?negative neurological ROS ? negative psych ROS  ? GI/Hepatic ?Neg liver ROS, GERD  Controlled,  ?Endo/Other  ?negative endocrine ROS ? Renal/GU ?negative Renal ROS  ?negative genitourinary ?  ?Musculoskeletal ? ? Abdominal ?  ?Peds ? Hematology ?negative hematology ROS ?(+)   ?Anesthesia Other Findings ?Past Medical History: ?No date: GERD (gastroesophageal reflux disease) ?No date: Internal hemorrhoids ? ?Past Surgical History: ?No date: COLONOSCOPY ?04/16/2016: FISTULOTOMY; N/A ?    Comment:  Procedure: FISTULOTOMY;  Surgeon: Leonie Green,  ?             MD;  Location: [facility] ORS;  Service: General;  Laterality:  ?             N/A; ?04/16/2016: RECTAL EXAM UNDER ANESTHESIA; N/A ?    Comment:  Procedure: RECTAL EXAM UNDER ANESTHESIA;  Surgeon:  ?             Leonie Green, MD;  Location: [facility] ORS;  Service:  ?             General;  Laterality: N/A; ? ? ? ? Reproductive/Obstetrics ?negative OB ROS ? ?  ? ? ? ? ? ? ? ? ? ? ? ? ? ?  ?  ? ? ? ? ? ? ? ? ?Anesthesia Physical ?Anesthesia Plan ? ?ASA: 2 ? ?Anesthesia Plan: General  ? ?Post-op Pain Management:   ? ?Induction: Intravenous ? ?PONV Risk Score and Plan: Propofol infusion and TIVA ? ?Airway Management Planned: Natural Airway and Nasal Cannula ? ?Additional Equipment:  ? ?Intra-op Plan:  ? ?Post-operative Plan:  ? ?Informed Consent: I  have reviewed the patients History and Physical, chart, labs and discussed the procedure including the risks, benefits and alternatives for the proposed anesthesia with the patient or authorized representative who has indicated his/her understanding and acceptance.  ? ? ? ?Dental Advisory Given ? ?Plan Discussed with: Anesthesiologist, CRNA and Surgeon ? ?Anesthesia Plan Comments: (Patient consented for risks of anesthesia including but not limited to:  ?- adverse reactions to medications ?- risk of airway placement if required ?- damage to eyes, teeth, lips or other oral mucosa ?- nerve damage due to positioning  ?- sore throat or hoarseness ?- Damage to heart, brain, nerves, lungs, other parts of body or loss of life ? ?Patient voiced understanding.)  ? ? ? ? ? ? ?Anesthesia Quick Evaluation ? ?

## 2021-04-02 NOTE — H&P (Signed)
Outpatient short stay form Pre-procedure ?04/02/2021  ?Dan Rubenstein, MD ? ?Primary Physician: Juluis Pitch, MD ? ?Reason for visit:  Surveillance colonoscopy ? ?History of present illness:   ? ?56 y/o gentleman with history of GERD here for surveillance colonoscopy due to history of adenomatous polyps. Last colonoscopy was in 2017 with small TA. No family history of GI malignancies. No blood thinners. No significant abdominal surgeries. ? ? ?Current Facility-Administered Medications:  ?  0.9 %  sodium chloride infusion, , Intravenous, Continuous, Adriahna Shearman, [name], MD, Last Rate: 20 mL/hr at 04/02/21 1355, New Bag at 04/02/21 1355 ? ?Medications Prior to Admission  ?Medication Sig Dispense Refill Last Dose  ? albuterol (VENTOLIN HFA) 108 (90 Base) MCG/ACT inhaler Inhale 2 puffs into the lungs every 4 (four) hours as needed for Wheezing 18 g 0 Past Month  ? omeprazole (PRILOSEC) 20 MG capsule Take 20 mg by mouth as directed. As directed: take once a day only while taking a significant about of ibuprofen.   Past Month  ? omeprazole (PRILOSEC) 20 MG capsule Take 1 capsule (20 mg total) by mouth once daily as needed 30 capsule 2 Past Month  ? amoxicillin (AMOXIL) 500 MG capsule Take 1 capsule by mouth three times daily. Start one day before surgery. (Patient not taking: Reported on 04/02/2021) 21 capsule 2 Completed Course  ? amoxicillin (AMOXIL) 500 MG capsule Take 1 four times a day until all are taken beginning the AM of surgery (Patient not taking: Reported on 04/02/2021) 40 capsule 0 Completed Course  ? chlorhexidine (PERIDEX) 0.12 % solution RINSE MOUTH WITH 15ML (1 CAPFUL) FOR 30 SECONDS AM AND PM AFTER TOOTHBRUSHING. EXPECTORATE AFTER RINSING, DO NOT SWALLOW (Patient not taking: Reported on 04/02/2021) 473 mL 0 Completed Course  ? chlorhexidine (PERIDEX) 0.12 % solution RINSE WITH 1/2 OZ. AND EXPECTORATE BID (Patient not taking: Reported on 04/02/2021) 473 mL 12 Completed Course  ?  HYDROcodone-acetaminophen (NORCO) 5-325 MG tablet Take 1-2 tablets by mouth every 4 (four) hours as needed for moderate pain. (Patient not taking: Reported on 04/02/2021) 12 tablet 0 Completed Course  ? ibuprofen (ADVIL) 600 MG tablet Take 1 tablet by mouth four times a day (Patient not taking: Reported on 04/02/2021) 16 tablet 0 Not Taking  ? ibuprofen (ADVIL,MOTRIN) 800 MG tablet Take 800 mg by mouth every 6 (six) hours as needed for pain. (Patient not taking: Reported on 04/02/2021)  0 Not Taking  ? Na Sulfate-K Sulfate-Mg Sulf 17.5-3.13-1.6 GM/177ML SOLN Take 2 Bottles (1 kit total) by mouth as directed One kit contains 2 bottles.  Take both bottles at the times instructed by your provider. 354 mL 0   ? naproxen (NAPROSYN) 250 MG tablet TAKE 2 TABLETS AM OF PROCEDURE AND 1 TABLET 3 TIMES A DAY AS NEEDED FOR PAIN. DO NOT EXCEED 5 TABLETS IN A 24 HOUR PERIOD. (Patient not taking: Reported on 04/02/2021) 16 tablet 0 Not Taking  ? omeprazole (PRILOSEC) 20 MG capsule Take 1 capsule (20 mg total) by mouth once daily as needed 30 capsule 2   ? oxyCODONE-acetaminophen (PERCOCET/ROXICET) 5-325 MG tablet TAKE 1 TABLET BY MOUTH EVERY 4 HOURS AS NEEDED FOR PAIN. (Patient not taking: Reported on 04/02/2021) 12 tablet 0 Not Taking  ? ? ? ?Allergies  ?Allergen Reactions  ? Baclofen Shortness Of Breath  ? Prednisone Other (See Comments)  ?  FLARES UP HEMORRHOIDS  ? ? ? ?Past Medical History:  ?Diagnosis Date  ? GERD (gastroesophageal reflux disease)   ? Internal  hemorrhoids   ? ? ?Review of systems:  Otherwise negative.  ? ? ?Physical Exam ? ?Gen: Alert, oriented. Appears stated age.  ?HEENT: PERRLA. ?Lungs: No respiratory distress ?CV: RRR ?Abd: soft, benign, no masses ?Ext: No edema ? ? ? ?Planned procedures: Proceed with colonoscopy. The patient understands the nature of the planned procedure, indications, risks, alternatives and potential complications including but not limited to bleeding, infection, perforation, damage to  internal organs and possible oversedation/side effects from anesthesia. The patient agrees and gives consent to proceed.  ?Please refer to procedure notes for findings, recommendations and patient disposition/instructions.  ? ? ? ?Dan Rubenstein, MD ?[facility] ? ? ? ?  ? ?

## 2021-04-02 NOTE — Interval H&P Note (Signed)
History and Physical Interval Note: ? ?04/02/2021 ?1:59 PM ? ?Dan Walsh  has presented today for surgery, with the diagnosis of history colon polyps.  The various methods of treatment have been discussed with the patient and family. After consideration of risks, benefits and other options for treatment, the patient has consented to  Procedure(s): ?COLONOSCOPY WITH PROPOFOL (N/A) as a surgical intervention.  The patient's history has been reviewed, patient examined, no change in status, stable for surgery.  I have reviewed the patient's chart and labs.  Questions were answered to the patient's satisfaction.   ? ? ?[name] [name] ? ?Ok to proceed with colonoscopy ?

## 2021-04-02 NOTE — Op Note (Signed)
[facility] ?Gastroenterology ?Patient Name: Dan Walsh ?Procedure Date: 04/02/2021 2:03 PM ?MRN: [id number] ?Account #: [id number] ?Date of Birth: 09-25-1965 ?Admit Type: Outpatient ?Age: 56 ?Room: [facility] ENDO ROOM 3 ?Gender: Male ?Note Status: Finalized ?Instrument Name: Colonoscope [id number] ?Procedure:             Colonoscopy ?Indications:           Surveillance: Personal history of adenomatous polyps  ?                       on last colonoscopy 5 years ago ?Providers:             Andrey Farmer MD, MD ?Referring MD:          Youlanda Roys. Lovie Macadamia, MD (Referring MD) ?Medicines:             Monitored Anesthesia Care ?Complications:         No immediate complications. Estimated blood loss:  ?                       Minimal. ?Procedure:             Pre-Anesthesia Assessment: ?                       - Prior to the procedure, a History and Physical was  ?                       performed, and patient medications and allergies were  ?                       reviewed. The patient is competent. The risks and  ?                       benefits of the procedure and the sedation options and  ?                       risks were discussed with the patient. All questions  ?                       were answered and informed consent was obtained.  ?                       Patient identification and proposed procedure were  ?                       verified by the physician, the nurse, the  ?                       anesthesiologist, the anesthetist and the technician  ?                       in the endoscopy suite. Mental Status Examination:  ?                       alert and oriented. Airway Examination: normal  ?                       oropharyngeal airway and neck mobility. Respiratory  ?  Examination: clear to auscultation. CV Examination:  ?                       normal. Prophylactic Antibiotics: The patient does not  ?                       require prophylactic antibiotics. Prior  ?                        Anticoagulants: The patient has taken no previous  ?                       anticoagulant or antiplatelet agents. ASA Grade  ?                       Assessment: II - A patient with mild systemic disease.  ?                       After reviewing the risks and benefits, the patient  ?                       was deemed in satisfactory condition to undergo the  ?                       procedure. The anesthesia plan was to use monitored  ?                       anesthesia care (MAC). Immediately prior to  ?                       administration of medications, the patient was  ?                       re-assessed for adequacy to receive sedatives. The  ?                       heart rate, respiratory rate, oxygen saturations,  ?                       blood pressure, adequacy of pulmonary ventilation, and  ?                       response to care were monitored throughout the  ?                       procedure. The physical status of the patient was  ?                       re-assessed after the procedure. ?                       After obtaining informed consent, the colonoscope was  ?                       passed under direct vision. Throughout the procedure,  ?                       the patient's blood pressure, pulse, and oxygen  ?  saturations were monitored continuously. The  ?                       Colonoscope was introduced through the anus and  ?                       advanced to the the cecum, identified by appendiceal  ?                       orifice and ileocecal valve. The colonoscopy was  ?                       performed without difficulty. The patient tolerated  ?                       the procedure well. The quality of the bowel  ?                       preparation was good. ?Findings: ?     The perianal and digital rectal examinations were normal. ?     A 3 mm polyp was found in the distal transverse colon. The polyp was  ?     sessile. The polyp was removed with a cold snare.  Resection and  ?     retrieval were complete. Estimated blood loss was minimal. ?     Internal hemorrhoids were found during retroflexion. The hemorrhoids  ?     were Grade I (internal hemorrhoids that do not prolapse). ?     The exam was otherwise without abnormality on direct and retroflexion  ?     views. ?Impression:            - One 3 mm polyp in the distal transverse colon,  ?                       removed with a cold snare. Resected and retrieved. ?                       - Internal hemorrhoids. ?                       - The examination was otherwise normal on direct and  ?                       retroflexion views. ?Recommendation:        - Discharge patient to home. ?                       - Resume previous diet. ?                       - Continue present medications. ?                       - Await pathology results. ?                       - Repeat colonoscopy for surveillance based on  ?                       pathology results. ?                       -  Return to referring physician as previously  ?                       scheduled. ?Procedure Code(s):     --- Professional --- ?                       [phone], Colonoscopy, flexible; with removal of  ?                       tumor(s), polyp(s), or other lesion(s) by snare  ?                       technique ?Diagnosis Code(s):     --- Professional --- ?                       Z86.010, Personal history of colonic polyps ?                       K63.5, Polyp of colon ?                       K64.0, First degree hemorrhoids ?CPT copyright 2019 [employer]. All rights reserved. ?The codes documented in this report are preliminary and upon coder review may  ?be revised to meet current compliance requirements. ?Andrey Farmer MD, MD ?04/02/2021 2:28:35 PM ?Number of Addenda: 0 ?Note Initiated On: 04/02/2021 2:03 PM ?Scope Withdrawal Time: 0 hours 10 minutes 36 seconds  ?Total Procedure Duration: 0 hours 16 minutes 1 second  ?Estimated Blood Loss:   Estimated blood loss was minimal. ?     [facility] ?

## 2021-04-03 ENCOUNTER — Encounter: Payer: Self-pay | Admitting: Gastroenterology

## 2021-04-04 NOTE — Anesthesia Postprocedure Evaluation (Signed)
Anesthesia Post Note ? ?Patient: Dan Walsh ? ?Procedure(s) Performed: COLONOSCOPY WITH PROPOFOL ? ?Patient location during evaluation: PACU ?Anesthesia Type: General ?Level of consciousness: awake and alert ?Pain management: pain level controlled ?Vital Signs Assessment: post-procedure vital signs reviewed and stable ?Respiratory status: spontaneous breathing, nonlabored ventilation, respiratory function stable and patient connected to nasal cannula oxygen ?Cardiovascular status: blood pressure returned to baseline and stable ?Postop Assessment: no apparent nausea or vomiting ?Anesthetic complications: no ? ? ?No notable events documented. ? ? ?Last Vitals:  ?Vitals:  ? 04/02/21 1337 04/02/21 1432  ?BP: (!) 130/97   ?Pulse: 74   ?Resp: 18 18  ?Temp: (!) 36.3 ?C 36.8 ?C  ?SpO2: 100%   ?  ?Last Pain:  ?Vitals:  ? 04/02/21 1452  ?TempSrc:   ?PainSc: 0-No pain  ? ? ?  ?  ?  ?  ?  ?  ? ?Molli Barrows ? ? ? ? ?

## 2021-04-05 LAB — SURGICAL PATHOLOGY

## 2021-10-02 ENCOUNTER — Other Ambulatory Visit: Payer: Self-pay

## 2021-11-26 DIAGNOSIS — M5136 Other intervertebral disc degeneration, lumbar region: Secondary | ICD-10-CM | POA: Diagnosis not present

## 2021-11-26 DIAGNOSIS — M9903 Segmental and somatic dysfunction of lumbar region: Secondary | ICD-10-CM | POA: Diagnosis not present

## 2021-11-26 DIAGNOSIS — M5417 Radiculopathy, lumbosacral region: Secondary | ICD-10-CM | POA: Diagnosis not present

## 2021-11-26 DIAGNOSIS — M7918 Myalgia, other site: Secondary | ICD-10-CM | POA: Diagnosis not present

## 2021-11-26 DIAGNOSIS — M9904 Segmental and somatic dysfunction of sacral region: Secondary | ICD-10-CM | POA: Diagnosis not present

## 2022-01-28 DIAGNOSIS — M545 Low back pain, unspecified: Secondary | ICD-10-CM | POA: Diagnosis not present

## 2022-01-28 DIAGNOSIS — Z23 Encounter for immunization: Secondary | ICD-10-CM | POA: Diagnosis not present

## 2022-01-28 DIAGNOSIS — M5136 Other intervertebral disc degeneration, lumbar region: Secondary | ICD-10-CM | POA: Diagnosis not present

## 2022-01-28 DIAGNOSIS — M9903 Segmental and somatic dysfunction of lumbar region: Secondary | ICD-10-CM | POA: Diagnosis not present

## 2022-01-28 DIAGNOSIS — M5417 Radiculopathy, lumbosacral region: Secondary | ICD-10-CM | POA: Diagnosis not present

## 2022-01-28 DIAGNOSIS — M7918 Myalgia, other site: Secondary | ICD-10-CM | POA: Diagnosis not present

## 2022-01-28 DIAGNOSIS — G8929 Other chronic pain: Secondary | ICD-10-CM | POA: Diagnosis not present

## 2022-01-28 DIAGNOSIS — Z125 Encounter for screening for malignant neoplasm of prostate: Secondary | ICD-10-CM | POA: Diagnosis not present

## 2022-01-28 DIAGNOSIS — M9904 Segmental and somatic dysfunction of sacral region: Secondary | ICD-10-CM | POA: Diagnosis not present

## 2022-01-28 DIAGNOSIS — Z0000 Encounter for general adult medical examination without abnormal findings: Secondary | ICD-10-CM | POA: Diagnosis not present

## 2022-02-11 DIAGNOSIS — M7918 Myalgia, other site: Secondary | ICD-10-CM | POA: Diagnosis not present

## 2022-02-11 DIAGNOSIS — M5136 Other intervertebral disc degeneration, lumbar region: Secondary | ICD-10-CM | POA: Diagnosis not present

## 2022-02-11 DIAGNOSIS — M5417 Radiculopathy, lumbosacral region: Secondary | ICD-10-CM | POA: Diagnosis not present

## 2022-02-11 DIAGNOSIS — M9904 Segmental and somatic dysfunction of sacral region: Secondary | ICD-10-CM | POA: Diagnosis not present

## 2022-02-11 DIAGNOSIS — M9903 Segmental and somatic dysfunction of lumbar region: Secondary | ICD-10-CM | POA: Diagnosis not present

## 2022-02-26 DIAGNOSIS — M7918 Myalgia, other site: Secondary | ICD-10-CM | POA: Diagnosis not present

## 2022-02-26 DIAGNOSIS — M9903 Segmental and somatic dysfunction of lumbar region: Secondary | ICD-10-CM | POA: Diagnosis not present

## 2022-02-26 DIAGNOSIS — M5417 Radiculopathy, lumbosacral region: Secondary | ICD-10-CM | POA: Diagnosis not present

## 2022-02-26 DIAGNOSIS — M9904 Segmental and somatic dysfunction of sacral region: Secondary | ICD-10-CM | POA: Diagnosis not present

## 2022-02-26 DIAGNOSIS — M5136 Other intervertebral disc degeneration, lumbar region: Secondary | ICD-10-CM | POA: Diagnosis not present

## 2022-03-14 DIAGNOSIS — M7918 Myalgia, other site: Secondary | ICD-10-CM | POA: Diagnosis not present

## 2022-03-14 DIAGNOSIS — M5417 Radiculopathy, lumbosacral region: Secondary | ICD-10-CM | POA: Diagnosis not present

## 2022-03-14 DIAGNOSIS — M5136 Other intervertebral disc degeneration, lumbar region: Secondary | ICD-10-CM | POA: Diagnosis not present

## 2022-03-14 DIAGNOSIS — M9904 Segmental and somatic dysfunction of sacral region: Secondary | ICD-10-CM | POA: Diagnosis not present

## 2022-03-14 DIAGNOSIS — M9903 Segmental and somatic dysfunction of lumbar region: Secondary | ICD-10-CM | POA: Diagnosis not present

## 2022-03-19 DIAGNOSIS — M9904 Segmental and somatic dysfunction of sacral region: Secondary | ICD-10-CM | POA: Diagnosis not present

## 2022-03-19 DIAGNOSIS — M5136 Other intervertebral disc degeneration, lumbar region: Secondary | ICD-10-CM | POA: Diagnosis not present

## 2022-03-19 DIAGNOSIS — M9903 Segmental and somatic dysfunction of lumbar region: Secondary | ICD-10-CM | POA: Diagnosis not present

## 2022-03-19 DIAGNOSIS — M5417 Radiculopathy, lumbosacral region: Secondary | ICD-10-CM | POA: Diagnosis not present

## 2022-03-19 DIAGNOSIS — M7918 Myalgia, other site: Secondary | ICD-10-CM | POA: Diagnosis not present

## 2022-05-20 DIAGNOSIS — R972 Elevated prostate specific antigen [PSA]: Secondary | ICD-10-CM | POA: Diagnosis not present

## 2023-02-03 DIAGNOSIS — Z0000 Encounter for general adult medical examination without abnormal findings: Secondary | ICD-10-CM | POA: Diagnosis not present

## 2023-10-28 ENCOUNTER — Other Ambulatory Visit: Payer: Self-pay

## 2023-10-28 MED ORDER — PENICILLIN V POTASSIUM 500 MG PO TABS
500.0000 mg | ORAL_TABLET | Freq: Four times a day (QID) | ORAL | 0 refills | Status: AC
  Filled 2023-10-28: qty 28, 7d supply, fill #0

## 2024-01-06 ENCOUNTER — Other Ambulatory Visit: Payer: Self-pay

## 2024-01-06 MED ORDER — OMEPRAZOLE 20 MG PO CPDR
20.0000 mg | DELAYED_RELEASE_CAPSULE | Freq: Every day | ORAL | 0 refills | Status: AC | PRN
  Filled 2024-01-06: qty 30, 30d supply, fill #0
